# Patient Record
Sex: Female | Born: 1955
Health system: Southern US, Community
[De-identification: ages and names within clinical notes are randomized; demographics above are authoritative.]

## PROBLEM LIST (undated history)

## (undated) DIAGNOSIS — E119 Type 2 diabetes mellitus without complications: Secondary | ICD-10-CM

## (undated) DIAGNOSIS — T7840XA Allergy, unspecified, initial encounter: Secondary | ICD-10-CM

## (undated) DIAGNOSIS — K219 Gastro-esophageal reflux disease without esophagitis: Secondary | ICD-10-CM

## (undated) DIAGNOSIS — E079 Disorder of thyroid, unspecified: Secondary | ICD-10-CM

## (undated) DIAGNOSIS — E785 Hyperlipidemia, unspecified: Secondary | ICD-10-CM

## (undated) DIAGNOSIS — I10 Essential (primary) hypertension: Secondary | ICD-10-CM

## (undated) DIAGNOSIS — G4730 Sleep apnea, unspecified: Secondary | ICD-10-CM

## (undated) HISTORY — DX: Allergy, unspecified, initial encounter: T78.40XA

## (undated) HISTORY — DX: Type 2 diabetes mellitus without complications: E11.9

## (undated) HISTORY — DX: Disorder of thyroid, unspecified: E07.9

## (undated) HISTORY — DX: Sleep apnea, unspecified: G47.30

## (undated) HISTORY — DX: Essential (primary) hypertension: I10

## (undated) HISTORY — DX: Gastro-esophageal reflux disease without esophagitis: K21.9

## (undated) HISTORY — DX: Hyperlipidemia, unspecified: E78.5

---

## 2016-04-22 DIAGNOSIS — E1159 Type 2 diabetes mellitus with other circulatory complications: Secondary | ICD-10-CM | POA: Insufficient documentation

## 2016-04-22 DIAGNOSIS — I10 Essential (primary) hypertension: Secondary | ICD-10-CM

## 2016-04-22 DIAGNOSIS — E119 Type 2 diabetes mellitus without complications: Secondary | ICD-10-CM | POA: Insufficient documentation

## 2016-04-22 DIAGNOSIS — K219 Gastro-esophageal reflux disease without esophagitis: Secondary | ICD-10-CM | POA: Insufficient documentation

## 2016-04-22 DIAGNOSIS — E559 Vitamin D deficiency, unspecified: Secondary | ICD-10-CM | POA: Insufficient documentation

## 2016-07-24 DIAGNOSIS — E039 Hypothyroidism, unspecified: Secondary | ICD-10-CM | POA: Insufficient documentation

## 2016-12-18 DIAGNOSIS — Z6841 Body Mass Index (BMI) 40.0 and over, adult: Secondary | ICD-10-CM

## 2018-06-09 ENCOUNTER — Encounter: Payer: Self-pay | Admitting: Family Medicine

## 2018-06-09 ENCOUNTER — Ambulatory Visit: Payer: BLUE CROSS/BLUE SHIELD | Admitting: Family Medicine

## 2018-06-09 VITALS — BP 135/73 | HR 60 | Temp 98.0°F | Ht 65.0 in | Wt 259.0 lb

## 2018-06-09 DIAGNOSIS — E559 Vitamin D deficiency, unspecified: Secondary | ICD-10-CM

## 2018-06-09 DIAGNOSIS — I10 Essential (primary) hypertension: Secondary | ICD-10-CM | POA: Diagnosis not present

## 2018-06-09 DIAGNOSIS — E039 Hypothyroidism, unspecified: Secondary | ICD-10-CM

## 2018-06-09 DIAGNOSIS — Z114 Encounter for screening for human immunodeficiency virus [HIV]: Secondary | ICD-10-CM

## 2018-06-09 DIAGNOSIS — Z130 Encounter for screening for diseases of the blood and blood-forming organs and certain disorders involving the immune mechanism: Secondary | ICD-10-CM

## 2018-06-09 DIAGNOSIS — Z7689 Persons encountering health services in other specified circumstances: Secondary | ICD-10-CM

## 2018-06-09 DIAGNOSIS — E119 Type 2 diabetes mellitus without complications: Secondary | ICD-10-CM | POA: Diagnosis not present

## 2018-06-09 MED ORDER — HYDROCHLOROTHIAZIDE 25 MG PO TABS: 25.0000 mg | ORAL_TABLET | Freq: Every day | ORAL | 1 refills | Status: DC

## 2018-06-09 NOTE — Patient Instructions (Addendum)
I have placed future orders.  Please come in fasting.  You may take your medications that morning but only with water or black coffee.  Schedule a full physical exam for sometime in January or February.  We will plan for twice yearly labs thereafter if all of your levels are at goal.  Schedule a mammogram appointment when you leave today.  Schedule an eye exam with Dr. Conley Rolls. We will discuss colon cancer screening again at your next visit.  We discussed Cologuard as an alternative to Colonoscopy today.  Continue your current medication regimen.  Your hydrochlorthiazide has been sent to the pharmacy.   Tips for Eating Away From Home If You Have Diabetes Controlling your level of blood glucose, also known as blood sugar, can be challenging. It can be even more difficult when you do not prepare your own meals. The following tips can help you manage your diabetes when you eat away from home. Planning ahead Plan ahead if you know you will be eating away from home:  Ask your health care provider how to time meals and medicine if you are taking insulin.  Make a list of restaurants near you that offer healthy choices. If they have a carry-out menu, take it home and plan what you will order ahead of time.  Look up the restaurant you want to eat at online. Many chain and fast-food restaurants list nutritional information online. Use this information to choose the healthiest options and to calculate how many carbohydrates will be in your meal.  Use a carbohydrate-counting book or mobile app to look up the carbohydrate content and serving size of the foods you want to eat.  Become familiar with serving sizes and learn to recognize how many servings are in a portion. This will allow you to estimate how many carbohydrates you can eat.  Free foods A "free food" is any food or drink that has less than 5 g of carbohydrates per serving. Free foods include:  Many vegetables.  Hard boiled eggs.  Nuts or  seeds.  Olives.  Cheeses.  Meats.  These types of foods make good appetizer choices and are often available at salad bars. Lemon juice, vinegar, or a low-calorie salad dressing of fewer than 20 calories per serving can be used as a "free" salad dressing. Choices to reduce carbohydrates  Substitute nonfat sweetened yogurt with a sugar-free yogurt. Yogurt made from soy milk may also be used, but you will still want a sugar-free or plain option to choose a lower carbohydrate amount.  Ask your server to take away the bread basket or chips from your table.  Order fresh fruit. A salad bar often offers fresh fruit choices. Avoid canned fruit because it is usually packed in sugar or syrup.  Order a salad, and eat it without dressing. Or, create a "free" salad dressing.  Ask for substitutions. For example, instead of [place] fries, request an order of a vegetable such as salad, green beans, or broccoli. Other tips  If you take insulin, take the insulin once your food arrives to your table. This will ensure your insulin and food are timed correctly.  Ask your server about the portion size before your order, and ask for a take-out box if the portion has more servings than you should have. When your food comes, leave the amount you should have on the plate, and put the rest in the take-out box.  Consider splitting an entree with someone and ordering a side salad. This information  is not intended to replace advice given to you by your health care provider. Make sure you discuss any questions you have with your health care provider. Document Released: 09/23/2005 Document Revised: 02/29/2016 Document Reviewed: 12/21/2013 Elsevier Interactive Patient Education  [employer].

## 2018-06-09 NOTE — Progress Notes (Signed)
Subjective: VX:[id number] care, HTN HPI: Heather Palmer is a 62 y.o. female presenting to clinic today for:  1. Hypertension  Patient reports compliance with her hydrochlorothiazide, atenolol and Cozaar.  She states that she needs a refill on the hydrochlorothiazide. Denies headache, dizziness, nausea, vomiting, chest pain, LE swelling, abdominal pain or shortness of breath.  She does report that she needs a new eye exam but has been putting this off because she is expecting to switch over to [facility] in January.  2.  Hypothyroidism Patient reports a several year history of hypothyroidism.  She reports occasional diarrhea that seems to be associated with her food intake.  No central tremor, heart palpitations.  She reports compliance with her current dose of Synthroid.  Last TSH was performed in February 2019 and was within normal limits.  3.  Type 2 diabetes with hyperlipidemia Patient reports compliance with metformin 1000 mg p.o. twice daily, Lipitor 40 mg daily.  She is on an ARB as above.  She is due for diabetic eye exam.  Denies any history of foot ulcer.  She had a urine microalbumin with her previous PCP that was normal recently.  4.  Preventive care Patient reports that she has never had a colonoscopy or any type of colon cancer screening.  No known family history of colon cancer.  Denies any rectal bleeding or unplanned weight loss.  She notes that mammogram has been greater than 2 years.  Denies any breast concerns.  She is up-to-date on hepatitis C screening.  May need DEXA scan.  No HIV screening appreciated in EMR.   Past Medical History:  Diagnosis Date  . Allergy   . Diabetes mellitus without complication ([place])   . GERD (gastroesophageal reflux disease)   . Hyperlipidemia   . Hypertension   . Sleep apnea   . Thyroid disease    Past Surgical History:  Procedure Laterality Date  . CESAREAN SECTION     Social History   Socioeconomic History  . Marital status: Married      Spouse name: Not on file  . Number of children: Not on file  . Years of education: Not on file  . Highest education level: Not on file  Occupational History  . Not on file  Social Needs  . Financial resource strain: Not on file  . Food insecurity:    Worry: Not on file    Inability: Not on file  . Transportation needs:    Medical: Not on file    Non-medical: Not on file  Tobacco Use  . Smoking status: Never Smoker  . Smokeless tobacco: Never Used  Substance and Sexual Activity  . Alcohol use: Never    Frequency: Never  . Drug use: Never  . Sexual activity: Not on file  Lifestyle  . Physical activity:    Days per week: Not on file    Minutes per session: Not on file  . Stress: Not on file  Relationships  . Social connections:    Talks on phone: Not on file    Gets together: Not on file    Attends religious service: Not on file    Active member of club or organization: Not on file    Attends meetings of clubs or organizations: Not on file    Relationship status: Not on file  . Intimate partner violence:    Fear of current or ex partner: Not on file    Emotionally abused: Not on file    Physically  abused: Not on file    Forced sexual activity: Not on file  Other Topics Concern  . Not on file  Social History Narrative  . Not on file   Current Meds  Medication Sig  . atenolol (TENORMIN) 100 MG tablet Take 100 mg by mouth daily.  Marland Kitchen atorvastatin (LIPITOR) 40 MG tablet Take 40 mg by mouth daily.  . Cholecalciferol 5000 units capsule Take by mouth.  . famotidine (PEPCID) 20 MG tablet TAKE 1 TABLET BY MOUTH TWICE DAILY AS NEEDED FOR HEARTBURN  . hydrochlorothiazide (HYDRODIURIL) 25 MG tablet Take 1 tablet (25 mg total) by mouth daily.  Marland Kitchen levothyroxine (SYNTHROID, LEVOTHROID) 50 MCG tablet Take 50 mcg by mouth daily.  Marland Kitchen losartan (COZAAR) 25 MG tablet Take by mouth.  . metFORMIN (GLUCOPHAGE) 500 MG tablet Take 1,000 mg by mouth 2 (two) times daily with a meal.  .  [DISCONTINUED] hydrochlorothiazide (HYDRODIURIL) 25 MG tablet Take 25 mg by mouth daily.   Family History  Problem Relation Age of Onset  . Diabetes Mother   . Heart disease Mother   . COPD Father    Allergies  Allergen Reactions  . Carvedilol      Health Maintenance: Needs Mammo, Colon cancer screen, HIV screen ROS: Per HPI  Objective: Office vital signs reviewed. BP 135/73   Pulse 60   Temp 98 F (36.7 C) (Oral)   Ht '5\' 5"'  (1.651 m)   Wt 259 lb (117.5 kg)   BMI 43.10 kg/m   Physical Examination:  General: Awake, alert, well nourished, obese, No acute distress HEENT: Normal    Neck: No masses palpated. No lymphadenopathy; thyroid slightly full but no appreciable goiter or thyroid nodules.    Eyes: PERRLA, extraocular movement in tact, sclera white, no exophthalmos    Throat: moist mucus membranes Cardio: regular rate and rhythm, S1S2 heard, no murmurs appreciated Pulm: clear to auscultation bilaterally, no wheezes, rhonchi or rales; normal work of breathing on room air Extremities: warm, well perfused, No edema, cyanosis or clubbing; +2 pulses bilaterally MSK: Normal gait and normal station Skin: dry; intact; no rashes or lesions Neuro: No resting tremor Depression screen PHQ 2/9 06/09/2018  Decreased Interest 0  Down, Depressed, Hopeless 0  PHQ - 2 Score 0     Assessment/ Plan: 61 y.o. female   1. Essential hypertension Blood pressure under good control on current regimen.  Hydrochlorthiazide has been refilled.  I reviewed her metabolic panel from February 2019.  She did have a slightly elevated alkaline phosphatase, I wonder if this is related to her diabetes.  We will plan to recheck CMP with her fasting labs.  We will also check lipid panel. - CMP14+EGFR; Future - Lipid Panel; Future  2. Encounter to establish care  3. Acquired hypothyroidism TSH was within normal limits in February.  We will plan to check this annually versus biannually pending control  with current dose of thyroid medicine. - TSH; Future  4. Controlled type 2 diabetes mellitus without complication, without long-term current use of insulin (HCC) A1c from February 2019 was within normal limits.  I reviewed her last several hemoglobin A1c's which have always been around 6.5-6.6.  This level was not repeated today given stability.  We will plan to obtain with her February labs. - Bayer DCA Hb A1c Waived; Future - Lipid Panel; Future  5. Vitamin D deficiency Currently on OTC vitamin D.  Will need DEXA scan at some point. - VITAMIN D 25 Hydroxy (Vit-D Deficiency, Fractures); Future  6. Screening, anemia, deficiency, iron Will check CBC given use of metformin. - CBC with Differential; Future  7. Screening for HIV without presence of risk factors - HIV antibody (with reflex); Future   Torrance Frech Wind[place], [place] [phone]

## 2018-08-18 ENCOUNTER — Other Ambulatory Visit: Payer: Self-pay | Admitting: *Deleted

## 2018-08-18 MED ORDER — METFORMIN HCL 500 MG PO TABS: 1000.0000 mg | ORAL_TABLET | Freq: Two times a day (BID) | ORAL | 1 refills | Status: DC

## 2018-08-18 MED ORDER — ATENOLOL 100 MG PO TABS: 100.0000 mg | ORAL_TABLET | Freq: Every day | ORAL | 1 refills | Status: DC

## 2018-08-18 MED ORDER — LEVOTHYROXINE SODIUM 50 MCG PO TABS: 50.0000 ug | ORAL_TABLET | Freq: Every day | ORAL | 1 refills | Status: DC

## 2018-08-18 MED ORDER — ATORVASTATIN CALCIUM 40 MG PO TABS: 40.0000 mg | ORAL_TABLET | Freq: Every day | ORAL | 1 refills | Status: DC

## 2018-08-21 ENCOUNTER — Other Ambulatory Visit: Payer: Self-pay | Admitting: Family Medicine

## 2018-08-21 MED ORDER — LOSARTAN POTASSIUM 25 MG PO TABS: 25.0000 mg | ORAL_TABLET | Freq: Every day | ORAL | 3 refills | Status: DC

## 2018-08-21 NOTE — Telephone Encounter (Signed)
What is the name of the medication? Losartan 25 mg Patient is out and needs today for her dosage for today. Thought it was done yesterday with the other meds and she didn't have it.  Have you contacted your pharmacy to request a refill? YES  Which pharmacy would you like this sent to? [facility] in [place]   Patient notified that their request is being sent to the clinical staff for review and that they should receive a call once it is complete. If they do not receive a call within 24 hours they can check with their pharmacy or our office.

## 2018-09-10 ENCOUNTER — Ambulatory Visit (INDEPENDENT_AMBULATORY_CARE_PROVIDER_SITE_OTHER): Payer: BLUE CROSS/BLUE SHIELD | Admitting: *Deleted

## 2018-09-10 DIAGNOSIS — Z23 Encounter for immunization: Secondary | ICD-10-CM | POA: Diagnosis not present

## 2018-09-28 ENCOUNTER — Other Ambulatory Visit: Payer: Self-pay | Admitting: Family Medicine

## 2018-09-28 MED ORDER — FAMOTIDINE 20 MG PO TABS: ORAL_TABLET | ORAL | 1 refills | Status: DC

## 2018-11-09 ENCOUNTER — Encounter: Payer: BLUE CROSS/BLUE SHIELD | Admitting: Family Medicine

## 2018-11-30 ENCOUNTER — Ambulatory Visit (INDEPENDENT_AMBULATORY_CARE_PROVIDER_SITE_OTHER): Payer: Medicare Other | Admitting: Family Medicine

## 2018-11-30 VITALS — BP 133/74 | HR 72 | Temp 98.2°F | Ht 65.0 in | Wt 258.0 lb

## 2018-11-30 DIAGNOSIS — E039 Hypothyroidism, unspecified: Secondary | ICD-10-CM

## 2018-11-30 DIAGNOSIS — I10 Essential (primary) hypertension: Secondary | ICD-10-CM

## 2018-11-30 DIAGNOSIS — Z124 Encounter for screening for malignant neoplasm of cervix: Secondary | ICD-10-CM | POA: Diagnosis not present

## 2018-11-30 DIAGNOSIS — Z0000 Encounter for general adult medical examination without abnormal findings: Secondary | ICD-10-CM

## 2018-11-30 DIAGNOSIS — Z130 Encounter for screening for diseases of the blood and blood-forming organs and certain disorders involving the immune mechanism: Secondary | ICD-10-CM

## 2018-11-30 DIAGNOSIS — N888 Other specified noninflammatory disorders of cervix uteri: Secondary | ICD-10-CM

## 2018-11-30 DIAGNOSIS — E119 Type 2 diabetes mellitus without complications: Secondary | ICD-10-CM

## 2018-11-30 DIAGNOSIS — E1159 Type 2 diabetes mellitus with other circulatory complications: Secondary | ICD-10-CM

## 2018-11-30 DIAGNOSIS — Z0001 Encounter for general adult medical examination with abnormal findings: Secondary | ICD-10-CM

## 2018-11-30 DIAGNOSIS — N898 Other specified noninflammatory disorders of vagina: Secondary | ICD-10-CM

## 2018-11-30 DIAGNOSIS — I152 Hypertension secondary to endocrine disorders: Secondary | ICD-10-CM

## 2018-11-30 DIAGNOSIS — G8929 Other chronic pain: Secondary | ICD-10-CM

## 2018-11-30 DIAGNOSIS — Z1211 Encounter for screening for malignant neoplasm of colon: Secondary | ICD-10-CM

## 2018-11-30 DIAGNOSIS — M25512 Pain in left shoulder: Secondary | ICD-10-CM | POA: Diagnosis not present

## 2018-11-30 DIAGNOSIS — E559 Vitamin D deficiency, unspecified: Secondary | ICD-10-CM

## 2018-11-30 DIAGNOSIS — Z114 Encounter for screening for human immunodeficiency virus [HIV]: Secondary | ICD-10-CM

## 2018-11-30 DIAGNOSIS — Z780 Asymptomatic menopausal state: Secondary | ICD-10-CM

## 2018-11-30 DIAGNOSIS — N930 Postcoital and contact bleeding: Secondary | ICD-10-CM

## 2018-11-30 LAB — WET PREP FOR TRICH, YEAST, CLUE
Clue Cell Exam: NEGATIVE
TRICHOMONAS EXAM: NEGATIVE
Yeast Exam: NEGATIVE

## 2018-11-30 LAB — BAYER DCA HB A1C WAIVED: HB A1C (BAYER DCA - WAIVED): 6.8 % (ref <7.0)

## 2018-11-30 NOTE — Patient Instructions (Addendum)
You had labs performed today.  You will be contacted with the results of the labs once they are available, usually in the next 3 business days for routine lab work.  If you had a pap smear or biopsy performed, expect to be contacted in about 7-10 days.  You are overdue for bone density scan.  You can have this done at any time. It has been ordered.  The cologuard has been ordered and you should receive it at your home soon.  Please send it back as soon as possible.    There was a little growth on the cervix which is what I think may be the cause of your bleeding/ discomfort.  I have referred you to the gyn for further evaluation/ removal.  Health Maintenance, Female Adopting a healthy lifestyle and getting preventive care can go a long way to promote health and wellness. Talk with your health care provider about what schedule of regular examinations is right for you. This is a good chance for you to check in with your provider about disease prevention and staying healthy. In between checkups, there are plenty of things you can do on your own. Experts have done a lot of research about which lifestyle changes and preventive measures are most likely to keep you healthy. Ask your health care provider for more information. Weight and diet Eat a healthy diet  Be sure to include plenty of vegetables, fruits, low-fat dairy products, and lean protein.  Do not eat a lot of foods high in solid fats, added sugars, or salt.  Get regular exercise. This is one of the most important things you can do for your health. ? Most adults should exercise for at least 150 minutes each week. The exercise should increase your heart rate and make you sweat (moderate-intensity exercise). ? Most adults should also do strengthening exercises at least twice a week. This is in addition to the moderate-intensity exercise. Maintain a healthy weight  Body mass index (BMI) is a measurement that can be used to identify possible  weight problems. It estimates body fat based on height and weight. Your health care provider can help determine your BMI and help you achieve or maintain a healthy weight.  For females 31 years of age and older: ? A BMI below 18.5 is considered underweight. ? A BMI of 18.5 to 24.9 is normal. ? A BMI of 25 to 29.9 is considered overweight. ? A BMI of 30 and above is considered obese. Watch levels of cholesterol and blood lipids  You should start having your blood tested for lipids and cholesterol at 65 years of age, then have this test every 5 years.  You may need to have your cholesterol levels checked more often if: ? Your lipid or cholesterol levels are high. ? You are older than 65 years of age. ? You are at high risk for heart disease. Cancer screening Lung Cancer  Lung cancer screening is recommended for adults 49-44 years old who are at high risk for lung cancer because of a history of smoking.  A yearly low-dose CT scan of the lungs is recommended for people who: ? Currently smoke. ? Have quit within the past 15 years. ? Have at least a 30-pack-year history of smoking. A pack year is smoking an average of one pack of cigarettes a day for 1 year.  Yearly screening should continue until it has been 15 years since you quit.  Yearly screening should stop if you develop a  health problem that would prevent you from having lung cancer treatment. Breast Cancer  Practice breast self-awareness. This means understanding how your breasts normally appear and feel.  It also means doing regular breast self-exams. Let your health care provider know about any changes, no matter how small.  If you are in your 20s or 30s, you should have a clinical breast exam (CBE) by a health care provider every 1-3 years as part of a regular health exam.  If you are 39 or older, have a CBE every year. Also consider having a breast X-ray (mammogram) every year.  If you have a family history of breast  cancer, talk to your health care provider about genetic screening.  If you are at high risk for breast cancer, talk to your health care provider about having an MRI and a mammogram every year.  Breast cancer gene (BRCA) assessment is recommended for women who have family members with BRCA-related cancers. BRCA-related cancers include: ? Breast. ? Ovarian. ? Tubal. ? Peritoneal cancers.  Results of the assessment will determine the need for genetic counseling and BRCA1 and BRCA2 testing. Cervical Cancer Your health care provider may recommend that you be screened regularly for cancer of the pelvic organs (ovaries, uterus, and vagina). This screening involves a pelvic examination, including checking for microscopic changes to the surface of your cervix (Pap test). You may be encouraged to have this screening done every 3 years, beginning at age 87.  For women ages 29-65, health care providers may recommend pelvic exams and Pap testing every 3 years, or they may recommend the Pap and pelvic exam, combined with testing for human papilloma virus (HPV), every 5 years. Some types of HPV increase your risk of cervical cancer. Testing for HPV may also be done on women of any age with unclear Pap test results.  Other health care providers may not recommend any screening for nonpregnant women who are considered low risk for pelvic cancer and who do not have symptoms. Ask your health care provider if a screening pelvic exam is right for you.  If you have had past treatment for cervical cancer or a condition that could lead to cancer, you need Pap tests and screening for cancer for at least 20 years after your treatment. If Pap tests have been discontinued, your risk factors (such as having a new sexual partner) need to be reassessed to determine if screening should resume. Some women have medical problems that increase the chance of getting cervical cancer. In these cases, your health care provider may  recommend more frequent screening and Pap tests. Colorectal Cancer  This type of cancer can be detected and often prevented.  Routine colorectal cancer screening usually begins at 65 years of age and continues through 65 years of age.  Your health care provider may recommend screening at an earlier age if you have risk factors for colon cancer.  Your health care provider may also recommend using home test kits to check for hidden blood in the stool.  A small camera at the end of a tube can be used to examine your colon directly (sigmoidoscopy or colonoscopy). This is done to check for the earliest forms of colorectal cancer.  Routine screening usually begins at age 16.  Direct examination of the colon should be repeated every 5-10 years through 65 years of age. However, you may need to be screened more often if early forms of precancerous polyps or small growths are found. Skin Cancer  Check your  skin from head to toe regularly.  Tell your health care provider about any new moles or changes in moles, especially if there is a change in a mole's shape or color.  Also tell your health care provider if you have a mole that is larger than the size of a pencil eraser.  Always use sunscreen. Apply sunscreen liberally and repeatedly throughout the day.  Protect yourself by wearing long sleeves, pants, a wide-brimmed hat, and sunglasses whenever you are outside. Heart disease, diabetes, and high blood pressure  High blood pressure causes heart disease and increases the risk of stroke. High blood pressure is more likely to develop in: ? People who have blood pressure in the high end of the normal range (130-139/85-89 mm Hg). ? People who are overweight or obese. ? People who are African American.  If you are 53-34 years of age, have your blood pressure checked every 3-5 years. If you are 36 years of age or older, have your blood pressure checked every year. You should have your blood pressure  measured twice-once when you are at a hospital or clinic, and once when you are not at a hospital or clinic. Record the average of the two measurements. To check your blood pressure when you are not at a hospital or clinic, you can use: ? An automated blood pressure machine at a pharmacy. ? A home blood pressure monitor.  If you are between 54 years and 65 years old, ask your health care provider if you should take aspirin to prevent strokes.  Have regular diabetes screenings. This involves taking a blood sample to check your fasting blood sugar level. ? If you are at a normal weight and have a low risk for diabetes, have this test once every three years after 65 years of age. ? If you are overweight and have a high risk for diabetes, consider being tested at a younger age or more often. Preventing infection Hepatitis B  If you have a higher risk for hepatitis B, you should be screened for this virus. You are considered at high risk for hepatitis B if: ? You were born in a country where hepatitis B is common. Ask your health care provider which countries are considered high risk. ? Your parents were born in a high-risk country, and you have not been immunized against hepatitis B (hepatitis B vaccine). ? You have HIV or AIDS. ? You use needles to inject street drugs. ? You live with someone who has hepatitis B. ? You have had sex with someone who has hepatitis B. ? You get hemodialysis treatment. ? You take certain medicines for conditions, including cancer, organ transplantation, and autoimmune conditions. Hepatitis C  Blood testing is recommended for: ? Everyone born from [birth year] through 1967. ? Anyone with known risk factors for hepatitis C. Sexually transmitted infections (STIs)  You should be screened for sexually transmitted infections (STIs) including gonorrhea and chlamydia if: ? You are sexually active and are younger than 65 years of age. ? You are older than 65 years of age and  your health care provider tells you that you are at risk for this type of infection. ? Your sexual activity has changed since you were last screened and you are at an increased risk for chlamydia or gonorrhea. Ask your health care provider if you are at risk.  If you do not have HIV, but are at risk, it may be recommended that you take a prescription medicine daily to prevent HIV  infection. This is called pre-exposure prophylaxis (PrEP). You are considered at risk if: ? You are sexually active and do not regularly use condoms or know the HIV status of your partner(s). ? You take drugs by injection. ? You are sexually active with a partner who has HIV. Talk with your health care provider about whether you are at high risk of being infected with HIV. If you choose to begin PrEP, you should first be tested for HIV. You should then be tested every 3 months for as long as you are taking PrEP. Pregnancy  If you are premenopausal and you may become pregnant, ask your health care provider about preconception counseling.  If you may become pregnant, take 400 to 800 micrograms (mcg) of folic acid every day.  If you want to prevent pregnancy, talk to your health care provider about birth control (contraception). Osteoporosis and menopause  Osteoporosis is a disease in which the bones lose minerals and strength with aging. This can result in serious bone fractures. Your risk for osteoporosis can be identified using a bone density scan.  If you are 70 years of age or older, or if you are at risk for osteoporosis and fractures, ask your health care provider if you should be screened.  Ask your health care provider whether you should take a calcium or vitamin D supplement to lower your risk for osteoporosis.  Menopause may have certain physical symptoms and risks.  Hormone replacement therapy may reduce some of these symptoms and risks. Talk to your health care provider about whether hormone replacement  therapy is right for you. Follow these instructions at home:  Schedule regular health, dental, and eye exams.  Stay current with your immunizations.  Do not use any tobacco products including cigarettes, chewing tobacco, or electronic cigarettes.  If you are pregnant, do not drink alcohol.  If you are breastfeeding, limit how much and how often you drink alcohol.  Limit alcohol intake to no more than 1 drink per day for nonpregnant women. One drink equals 12 ounces of beer, 5 ounces of wine, or 1 ounces of hard liquor.  Do not use street drugs.  Do not share needles.  Ask your health care provider for help if you need support or information about quitting drugs.  Tell your health care provider if you often feel depressed.  Tell your health care provider if you have ever been abused or do not feel safe at home. This information is not intended to replace advice given to you by your health care provider. Make sure you discuss any questions you have with your health care provider. Document Released: 04/08/2011 Document Revised: 02/29/2016 Document Reviewed: 06/27/2015 Elsevier Interactive Patient Education  2019 [employer].
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
## 2018-12-01 LAB — CBC WITH DIFFERENTIAL/PLATELET
Basophils Absolute: 0.1 10*3/uL (ref 0.0–0.2)
Basos: 1 %
EOS (ABSOLUTE): 0.3 10*3/uL (ref 0.0–0.4)
Eos: 3 %
Hematocrit: 36.3 % (ref 34.0–46.6)
Hemoglobin: 12.8 g/dL (ref 11.1–15.9)
IMMATURE GRANS (ABS): 0 10*3/uL (ref 0.0–0.1)
Immature Granulocytes: 0 %
LYMPHS: 27 %
Lymphocytes Absolute: 2.6 10*3/uL (ref 0.7–3.1)
MCH: 30.6 pg (ref 26.6–33.0)
MCHC: 35.3 g/dL (ref 31.5–35.7)
MCV: 87 fL (ref 79–97)
Monocytes Absolute: 0.6 10*3/uL (ref 0.1–0.9)
Monocytes: 7 %
Neutrophils Absolute: 5.8 10*3/uL (ref 1.4–7.0)
Neutrophils: 62 %
Platelets: 337 10*3/uL (ref 150–450)
RBC: 4.18 x10E6/uL (ref 3.77–5.28)
RDW: 12.4 % (ref 11.7–15.4)
WBC: 9.4 10*3/uL (ref 3.4–10.8)

## 2018-12-01 LAB — CMP14+EGFR
ALT: 17 IU/L (ref 0–32)
AST: 16 IU/L (ref 0–40)
Albumin/Globulin Ratio: 1.5 (ref 1.2–2.2)
Albumin: 4.4 g/dL (ref 3.8–4.8)
Alkaline Phosphatase: 148 IU/L — ABNORMAL HIGH (ref 39–117)
BUN/Creatinine Ratio: 16 (ref 12–28)
BUN: 14 mg/dL (ref 8–27)
Bilirubin Total: 0.2 mg/dL (ref 0.0–1.2)
CO2: 22 mmol/L (ref 20–29)
Calcium: 9.8 mg/dL (ref 8.7–10.3)
Chloride: 100 mmol/L (ref 96–106)
Creatinine, Ser: 0.86 mg/dL (ref 0.57–1.00)
GFR calc Af Amer: 82 mL/min/{1.73_m2} (ref >59)
GFR calc non Af Amer: 71 mL/min/{1.73_m2} (ref >59)
Globulin, Total: 3 g/dL (ref 1.5–4.5)
Glucose: 133 mg/dL — ABNORMAL HIGH (ref 65–99)
Potassium: 3.9 mmol/L (ref 3.5–5.2)
Sodium: 141 mmol/L (ref 134–144)
Total Protein: 7.4 g/dL (ref 6.0–8.5)

## 2018-12-01 LAB — HIV ANTIBODY (ROUTINE TESTING W REFLEX): HIV SCREEN 4TH GENERATION: NONREACTIVE

## 2018-12-01 LAB — LIPID PANEL
Chol/HDL Ratio: 3.1 ratio (ref 0.0–4.4)
Cholesterol, Total: 135 mg/dL (ref 100–199)
HDL: 43 mg/dL (ref >39)
LDL Calculated: 62 mg/dL (ref 0–99)
Triglycerides: 150 mg/dL — ABNORMAL HIGH (ref 0–149)
VLDL Cholesterol Cal: 30 mg/dL (ref 5–40)

## 2018-12-01 LAB — TSH: TSH: 2.84 u[IU]/mL (ref 0.450–4.500)

## 2018-12-01 LAB — VITAMIN D 25 HYDROXY (VIT D DEFICIENCY, FRACTURES): Vit D, 25-Hydroxy: 57.4 ng/mL (ref 30.0–100.0)

## 2018-12-02 ENCOUNTER — Encounter: Payer: Self-pay | Admitting: *Deleted

## 2018-12-02 LAB — PAP IG, CT-NG NAA, HPV HIGH-RISK
Chlamydia, Nuc. Acid Amp: NEGATIVE
Gonococcus by Nucleic Acid Amp: NEGATIVE
HPV, high-risk: NEGATIVE

## 2018-12-09 ENCOUNTER — Encounter: Payer: Self-pay | Admitting: Family Medicine

## 2018-12-21 ENCOUNTER — Other Ambulatory Visit: Payer: Self-pay

## 2018-12-21 ENCOUNTER — Other Ambulatory Visit: Payer: Medicare Other

## 2018-12-21 ENCOUNTER — Ambulatory Visit (INDEPENDENT_AMBULATORY_CARE_PROVIDER_SITE_OTHER): Payer: Medicare Other

## 2018-12-21 DIAGNOSIS — Z780 Asymptomatic menopausal state: Secondary | ICD-10-CM | POA: Diagnosis not present

## 2018-12-21 DIAGNOSIS — Z13820 Encounter for screening for osteoporosis: Secondary | ICD-10-CM | POA: Diagnosis not present

## 2019-02-05 ENCOUNTER — Telehealth: Payer: Self-pay | Admitting: Family Medicine

## 2019-02-11 ENCOUNTER — Other Ambulatory Visit: Payer: Self-pay | Admitting: Family Medicine

## 2019-02-12 ENCOUNTER — Other Ambulatory Visit: Payer: Self-pay | Admitting: Family Medicine

## 2019-02-16 ENCOUNTER — Other Ambulatory Visit: Payer: Self-pay | Admitting: Family Medicine

## 2019-03-10 ENCOUNTER — Other Ambulatory Visit: Payer: Self-pay | Admitting: *Deleted

## 2019-03-10 MED ORDER — HYDROCHLOROTHIAZIDE 25 MG PO TABS: 25.0000 mg | ORAL_TABLET | Freq: Every day | ORAL | 0 refills | Status: DC

## 2019-03-22 ENCOUNTER — Other Ambulatory Visit: Payer: Self-pay | Admitting: Family Medicine

## 2019-03-23 NOTE — Telephone Encounter (Signed)
Gottschalk. NTBS for 3 mos ckup (May) refill sent to pharmacy

## 2019-03-23 NOTE — Telephone Encounter (Signed)
Left message for patient to call to schedule diabetic recheck.

## 2019-04-14 ENCOUNTER — Telehealth: Payer: Self-pay | Admitting: Family Medicine

## 2019-05-10 ENCOUNTER — Ambulatory Visit (INDEPENDENT_AMBULATORY_CARE_PROVIDER_SITE_OTHER): Payer: Medicare Other | Admitting: Family Medicine

## 2019-05-10 ENCOUNTER — Encounter: Payer: Self-pay | Admitting: Family Medicine

## 2019-05-10 ENCOUNTER — Other Ambulatory Visit: Payer: Self-pay

## 2019-05-10 VITALS — BP 116/70 | HR 72 | Temp 97.9°F | Ht 65.0 in | Wt 261.0 lb

## 2019-05-10 DIAGNOSIS — E039 Hypothyroidism, unspecified: Secondary | ICD-10-CM

## 2019-05-10 DIAGNOSIS — M67471 Ganglion, right ankle and foot: Secondary | ICD-10-CM

## 2019-05-10 DIAGNOSIS — E1159 Type 2 diabetes mellitus with other circulatory complications: Secondary | ICD-10-CM

## 2019-05-10 DIAGNOSIS — Z6841 Body Mass Index (BMI) 40.0 and over, adult: Secondary | ICD-10-CM

## 2019-05-10 DIAGNOSIS — Z23 Encounter for immunization: Secondary | ICD-10-CM | POA: Diagnosis not present

## 2019-05-10 DIAGNOSIS — E119 Type 2 diabetes mellitus without complications: Secondary | ICD-10-CM | POA: Diagnosis not present

## 2019-05-10 DIAGNOSIS — E785 Hyperlipidemia, unspecified: Secondary | ICD-10-CM

## 2019-05-10 DIAGNOSIS — I10 Essential (primary) hypertension: Secondary | ICD-10-CM

## 2019-05-10 DIAGNOSIS — E1169 Type 2 diabetes mellitus with other specified complication: Secondary | ICD-10-CM | POA: Diagnosis not present

## 2019-05-10 LAB — BAYER DCA HB A1C WAIVED: HB A1C (BAYER DCA - WAIVED): 6.6 % (ref <7.0)

## 2019-05-10 MED ORDER — ATENOLOL 100 MG PO TABS: 100.0000 mg | ORAL_TABLET | Freq: Every day | ORAL | 3 refills | Status: DC

## 2019-05-10 MED ORDER — HYDROCHLOROTHIAZIDE 25 MG PO TABS: 25.0000 mg | ORAL_TABLET | Freq: Every day | ORAL | 3 refills | Status: DC

## 2019-05-10 MED ORDER — METFORMIN HCL 500 MG PO TABS: 1000.0000 mg | ORAL_TABLET | Freq: Two times a day (BID) | ORAL | 3 refills | Status: DC

## 2019-05-10 MED ORDER — ATORVASTATIN CALCIUM 40 MG PO TABS: 40.0000 mg | ORAL_TABLET | Freq: Every day | ORAL | 3 refills | Status: DC

## 2019-05-10 NOTE — Progress Notes (Signed)
Subjective: CC: DM f/u PCP: Janora Norlander, DO Heather Palmer is a 63 y.o. female presenting to clinic today for:  1. Type 2 Diabetes w/ HTN and HLD:  Patient reports that she feels good. Taking medication(s): Metformin, Losartan/ HCTZ, Lipitor.  Last eye exam: needs; she will be scheduling with Dr. Marin Comment soon. Last foot exam: needs Last A1c:  Lab Results  Component Value Date   HGBA1C 6.8 11/30/2018   Nephropathy screen indicated?: on ARB Last flu, zoster and/or pneumovax:  Immunization History  Administered Date(s) Administered  . Influenza,inj,Quad PF,6+ Mos 09/10/2018  . Pneumococcal Polysaccharide-23 12/18/2016  . Tdap 12/18/2016    ROS: No chest pain, shortness of breath, lower extremity edema.  No sensation changes.  No visual disturbance.    ROS: Per HPI  Allergies  Allergen Reactions  . Carvedilol    Past Medical History:  Diagnosis Date  . Allergy   . Diabetes mellitus without complication ([place])   . GERD (gastroesophageal reflux disease)   . Hyperlipidemia   . Hypertension   . Sleep apnea   . Thyroid disease     Current Outpatient Medications:  .  atenolol (TENORMIN) 100 MG tablet, Take 1 tablet (100 mg total) by mouth daily., Disp: 90 tablet, Rfl: 3 .  atorvastatin (LIPITOR) 40 MG tablet, Take 1 tablet (40 mg total) by mouth daily., Disp: 90 tablet, Rfl: 3 .  Cholecalciferol 5000 units capsule, Take by mouth., Disp: , Rfl:  .  EUTHYROX 50 MCG tablet, Take 1 tablet by mouth once daily, Disp: 90 tablet, Rfl: 2 .  hydrochlorothiazide (HYDRODIURIL) 25 MG tablet, Take 1 tablet (25 mg total) by mouth daily., Disp: 90 tablet, Rfl: 3 .  losartan (COZAAR) 25 MG tablet, Take 1 tablet (25 mg total) by mouth daily., Disp: 90 tablet, Rfl: 3 .  metFORMIN (GLUCOPHAGE) 500 MG tablet, Take 2 tablets (1,000 mg total) by mouth 2 (two) times daily with a meal., Disp: 360 tablet, Rfl: 3 .  naproxen sodium (ALEVE) 220 MG tablet, Take 220 mg by mouth 2 (two) times daily  with a meal., Disp: , Rfl:  .  vitamin B-12 (CYANOCOBALAMIN) 1000 MCG tablet, Take 1,000 mcg by mouth daily., Disp: , Rfl:  .  vitamin C (ASCORBIC ACID) 500 MG tablet, Take 500 mg by mouth daily., Disp: , Rfl:  Social History   Socioeconomic History  . Marital status: Married    Spouse name: Not on file  . Number of children: Not on file  . Years of education: Not on file  . Highest education level: Not on file  Occupational History  . Not on file  Social Needs  . Financial resource strain: Not on file  . Food insecurity    Worry: Not on file    Inability: Not on file  . Transportation needs    Medical: Not on file    Non-medical: Not on file  Tobacco Use  . Smoking status: Never Smoker  . Smokeless tobacco: Never Used  Substance and Sexual Activity  . Alcohol use: Never    Frequency: Never  . Drug use: Never  . Sexual activity: Not on file  Lifestyle  . Physical activity    Days per week: Not on file    Minutes per session: Not on file  . Stress: Not on file  Relationships  . Social Herbalist on phone: Not on file    Gets together: Not on file    Attends religious service:  Not on file    Active member of club or organization: Not on file    Attends meetings of clubs or organizations: Not on file    Relationship status: Not on file  . Intimate partner violence    Fear of current or ex partner: Not on file    Emotionally abused: Not on file    Physically abused: Not on file    Forced sexual activity: Not on file  Other Topics Concern  . Not on file  Social History Narrative  . Not on file   Family History  Problem Relation Age of Onset  . Diabetes Mother   . Heart disease Mother   . COPD Father     Objective: Office vital signs reviewed. BP 116/70   Pulse 72   Temp 97.9 F (36.6 C) (Oral)   Ht 5\' 5"  (1.651 m)   Wt 261 lb (118.4 kg)   BMI 43.43 kg/m   Physical Examination:  General: Awake, alert, well nourished, No acute distress HEENT:  Normal, sclera white, MMM. No exophthalmos Cardio: regular rate and rhythm, S1S2 heard, no murmurs appreciated Pulm: clear to auscultation bilaterally, no wheezes, rhonchi or rales; normal work of breathing on room air Extremities: warm, well perfused, No edema, cyanosis or clubbing; +2 pulses bilaterally MSK: normal gait and station; small cyst palpable along lateral aspect of right achilles. Skin: dry; intact; no rashes or lesions Neuro: no tremor. See DM foot below Diabetic Foot Exam - Simple   Simple Foot Form Diabetic Foot exam was performed with the following findings: Yes 05/10/2019  3:33 PM  Visual Inspection See comments: Yes Sensation Testing Intact to touch and monofilament testing bilaterally: Yes Pulse Check Posterior Tibialis and Dorsalis pulse intact bilaterally: Yes Comments There is a small ganglion cyst noted on the right posterior aspect of the Achilles.  Additionally, she has varicosities noted along the medial aspects of bilateral feet      Assessment/ Plan: 63 y.o. female   1. Controlled type 2 diabetes mellitus without complication, without long-term current use of insulin (HCC) Under excellent control.  A1c 6.8 today.  Metformin refilled.  Get DM eye exam.  Foot exam done today. - Bayer DCA Hb A1c Waived  2. Hypertension associated with diabetes (HCC) Stable.  Continue dual regimen. Refills sent  3. Hyperlipidemia associated with type 2 diabetes mellitus (HCC) Stable.  Continue statin  4. Acquired hypothyroidism Defer TSH check until next visit. Asymptomatic. No refill needed  5. Ganglion cyst of right foot Appears to be a benign ganglion cyst.  Handout provided. Will refer if desires removal/ drainage  6. Morbid obesity with BMI of 40.0-44.9, adult (HCC) Working on lifestyle modification.    Orders Placed This Encounter  Procedures  . Bayer DCA Hb A1c Waived   Meds ordered this encounter  Medications  . atenolol (TENORMIN) 100 MG tablet     Sig: Take 1 tablet (100 mg total) by mouth daily.    Dispense:  90 tablet    Refill:  3  . atorvastatin (LIPITOR) 40 MG tablet    Sig: Take 1 tablet (40 mg total) by mouth daily.    Dispense:  90 tablet    Refill:  3  . hydrochlorothiazide (HYDRODIURIL) 25 MG tablet    Sig: Take 1 tablet (25 mg total) by mouth daily.    Dispense:  90 tablet    Refill:  3  . metFORMIN (GLUCOPHAGE) 500 MG tablet    Sig: Take 2 tablets (1,000  mg total) by mouth 2 (two) times daily with a meal.    Dispense:  360 tablet    Refill:  3     Joshu Furukawa M Elisabel Hanover, [facility]ulen [facility]ains [facility] [phone]

## 2019-05-10 NOTE — Patient Instructions (Addendum)
Please get your diabetic eye exam done and have them send me results. You are overdue for mammogram.  Please schedule this at your earliest convenience.  You had labs performed today.  You will be contacted with the results of the labs once they are available, usually in the next 3 business days for routine lab work.  If you have an active my chart account, they will be released to your MyChart.  If you prefer to have these labs released to you via telephone, please let us know.   Ganglion Cyst  A ganglion cyst is a non-cancerous, fluid-filled lump that occurs near a joint or tendon. The cyst grows out of a joint or the lining of a tendon. Ganglion cysts most often develop in the hand or wrist, but they can also develop in the shoulder, elbow, hip, knee, ankle, or foot. Ganglion cysts are ball-shaped or egg-shaped. Their size can range from the size of a pea to larger than a grape. Increased activity may cause the cyst to get bigger because more fluid starts to build up. What are the causes? The exact cause of this condition is not known, but it may be related to:  Inflammation or irritation around the joint.  An injury.  Repetitive movements or overuse.  Arthritis. What increases the risk? You are more likely to develop this condition if:  You are a woman.  You are 65-65 years old. What are the signs or symptoms? The main symptom of this condition is a lump. It most often appears on the hand or wrist. In many cases, there are no other symptoms, but a cyst can sometimes cause:  Tingling.  Pain.  Numbness.  Muscle weakness.  Weak grip.  Less range of motion in a joint. How is this diagnosed? Ganglion cysts are usually diagnosed based on a physical exam. Your health care provider will feel the lump and may shine a light next to it. If it is a ganglion cyst, the light will likely shine through it. Your health care provider may order an X-ray, ultrasound, or MRI to rule out  other conditions. How is this treated? Ganglion cysts often go away on their own without treatment. If you have pain or other symptoms, treatment may be needed. Treatment is also needed if the ganglion cyst limits your movement or if it gets infected. Treatment may include:  Wearing a brace or splint on your wrist or finger.  Taking anti-inflammatory medicine.  Having fluid drained from the lump with a needle (aspiration).  Getting a steroid injected into the joint.  Having surgery to remove the ganglion cyst.  Placing a pad on your shoe or wearing shoes that will not rub against the cyst if it is on your foot. Follow these instructions at home:  Do not press on the ganglion cyst, poke it with a needle, or hit it.  Take over-the-counter and prescription medicines only as told by your health care provider.  If you have a brace or splint: ? Wear it as told by your health care provider. ? Remove it as told by your health care provider. Ask if you need to remove it when you take a shower or a bath.  Watch your ganglion cyst for any changes.  Keep all follow-up visits as told by your health care provider. This is important. Contact a health care provider if:  Your ganglion cyst becomes larger or more painful.  You have pus coming from the lump.  You have weakness  or numbness in the affected area.  You have a fever or chills. Get help right away if:  You have a fever and have any of these in the cyst area: ? Increased redness. ? Red streaks. ? Swelling. Summary  A ganglion cyst is a non-cancerous, fluid-filled lump that occurs near a joint or tendon.  Ganglion cysts most often develop in the hand or wrist, but they can also develop in the shoulder, elbow, hip, knee, ankle, or foot.  Ganglion cysts often go away on their own without treatment. This information is not intended to replace advice given to you by your health care provider. Make sure you discuss any questions  you have with your health care provider. Document Released: 09/20/2000 Document Revised: 09/05/2017 Document Reviewed: 05/23/2017 Elsevier Patient Education  2020 [employer].

## 2019-05-10 NOTE — Addendum Note (Signed)
Addended byCarrolyn Leigh on: 05/10/2019 04:32 PM   Modules accepted: Orders

## 2019-05-11 ENCOUNTER — Telehealth: Payer: Self-pay | Admitting: Family Medicine

## 2019-05-11 MED ORDER — ACCU-CHEK SOFTCLIX LANCETS MISC: 12 refills | Status: AC

## 2019-05-11 MED ORDER — GLUCOSE BLOOD VI STRP: ORAL_STRIP | 12 refills | Status: AC

## 2019-05-11 MED ORDER — ULTICARE ALCOHOL SWABS 70 % PADS: 1.0000 | MEDICATED_PAD | Freq: Two times a day (BID) | 12 refills | Status: AC

## 2019-05-11 NOTE — Telephone Encounter (Signed)
What is the name of the medication? accu check soft click lancets and accu check plus test strips and alcohol wipes   Have you contacted your pharmacy to request a refill? No. Pt has apt with MMM yesterday and forgot to mention to her.  Which pharmacy would you like this sent to? [facility] in [place]   Patient notified that their request is being sent to the clinical staff for review and that they should receive a call once it is complete. If they do not receive a call within 24 hours they can check with their pharmacy or our office.

## 2019-05-11 NOTE — Telephone Encounter (Signed)
Rx sent over to the pharmacy and pt aware.

## 2019-06-18 ENCOUNTER — Other Ambulatory Visit: Payer: Self-pay

## 2019-06-21 ENCOUNTER — Ambulatory Visit (INDEPENDENT_AMBULATORY_CARE_PROVIDER_SITE_OTHER): Payer: Medicare Other

## 2019-06-21 ENCOUNTER — Other Ambulatory Visit: Payer: Self-pay

## 2019-06-21 ENCOUNTER — Ambulatory Visit (INDEPENDENT_AMBULATORY_CARE_PROVIDER_SITE_OTHER): Payer: Medicare Other | Admitting: Family Medicine

## 2019-06-21 ENCOUNTER — Encounter: Payer: Self-pay | Admitting: Family Medicine

## 2019-06-21 VITALS — BP 154/79 | HR 67 | Temp 95.3°F | Resp 20 | Ht 65.0 in | Wt 262.6 lb

## 2019-06-21 DIAGNOSIS — M545 Low back pain: Secondary | ICD-10-CM | POA: Diagnosis not present

## 2019-06-21 DIAGNOSIS — W19XXXA Unspecified fall, initial encounter: Secondary | ICD-10-CM

## 2019-06-21 DIAGNOSIS — M25562 Pain in left knee: Secondary | ICD-10-CM | POA: Diagnosis not present

## 2019-06-21 DIAGNOSIS — M5440 Lumbago with sciatica, unspecified side: Secondary | ICD-10-CM | POA: Diagnosis not present

## 2019-06-21 DIAGNOSIS — S3992XA Unspecified injury of lower back, initial encounter: Secondary | ICD-10-CM | POA: Diagnosis not present

## 2019-06-21 DIAGNOSIS — S8992XA Unspecified injury of left lower leg, initial encounter: Secondary | ICD-10-CM | POA: Diagnosis not present

## 2019-06-21 MED ORDER — CYCLOBENZAPRINE HCL 10 MG PO TABS: 10.0000 mg | ORAL_TABLET | Freq: Three times a day (TID) | ORAL | 1 refills | Status: DC | PRN

## 2019-06-21 MED ORDER — PREDNISONE 10 MG PO TABS: ORAL_TABLET | ORAL | 0 refills | Status: DC

## 2019-06-21 NOTE — Progress Notes (Signed)
Subjective:  Patient ID: Heather Palmer, female    DOB: [birth year]  Age: 63 y.o. MRN: [id number]  CC: Back Pain (after a fall x 3 weeks ago. Patient states that she was getting better but her back started hurting worse a few days ago. )   HPI Heather Palmer presents for Fall to the left side 3 weeks ago. Stumbled on something on the ground. Since then has had left lower back pain radiating to the left buttock and posterolateral thigh to the knee. She lifted something a few days ago and the pain got worse. Now pain is moderate and interfering with ambulation.   Depression screen [facility] 2/9 06/21/2019 05/10/2019 11/30/2018  Decreased Interest 0 0 0  Down, Depressed, Hopeless 0 0 0  PHQ - 2 Score 0 0 0  Altered sleeping - 0 1  Tired, decreased energy - 0 1  Change in appetite - 0 0  Feeling bad or failure about yourself  - 0 0  Trouble concentrating - 0 0  Moving slowly or fidgety/restless - 0 0  Suicidal thoughts - 0 0  PHQ-9 Score - 0 2  Difficult doing work/chores - Not difficult at all Not difficult at all    History Heather Palmer has a past medical history of Allergy, Diabetes mellitus without complication ([place]), GERD (gastroesophageal reflux disease), Hyperlipidemia, Hypertension, Sleep apnea, and Thyroid disease.   She has a past surgical history that includes Cesarean section.   Her family history includes COPD in her father; Diabetes in her mother; Heart disease in her mother.She reports that she has never smoked. She has never used smokeless tobacco. She reports that she does not drink alcohol or use drugs.    ROS Review of Systems  Constitutional: Negative.   HENT: Negative.   Eyes: Negative for visual disturbance.  Respiratory: Negative for shortness of breath.   Cardiovascular: Negative for chest pain.  Gastrointestinal: Negative for abdominal pain.  Musculoskeletal: Negative for arthralgias.    Objective:  BP (!) 154/79   Pulse 67   Temp (!) 95.3 F (35.2 C) (Temporal)   Resp 20    Ht 5\' 5"  (1.651 m)   Wt 262 lb 9.6 oz (119.1 kg)   SpO2 98%   BMI 43.70 kg/m   BP Readings from Last 3 Encounters:  06/21/19 (!) 154/79  05/10/19 116/70  11/30/18 133/74    Wt Readings from Last 3 Encounters:  06/21/19 262 lb 9.6 oz (119.1 kg)  05/10/19 261 lb (118.4 kg)  11/30/18 258 lb (117 kg)     Physical Exam Constitutional:      General: She is not in acute distress.    Appearance: She is well-developed.  Cardiovascular:     Rate and Rhythm: Normal rate and regular rhythm.  Pulmonary:     Breath sounds: Normal breath sounds.  Musculoskeletal:        General: Tenderness (over L5-S1, at sciatic notch. Also tender at left knee posteriorly. ) and signs of injury (bruising at left knee laterally and at left ankle.) present. No deformity.  Skin:    General: Skin is warm and dry.  Neurological:     Mental Status: She is alert and oriented to person, place, and time.       Assessment & Plan:   Heather Palmer was seen today for back pain.  Diagnoses and all orders for this visit:  Fall, initial encounter -     DG Knee 1-2 Views Left; Future -     DG  Lumbar Spine 2-3 Views; Future  Back pain of lumbar region with sciatica -     Ambulatory referral to Physical Therapy  Other orders -     predniSONE (DELTASONE) 10 MG tablet; Take 5 daily for 2 days followed by 4,3,2 and 1 for 2 days each. -     cyclobenzaprine (FLEXERIL) 10 MG tablet; Take 1 tablet (10 mg total) by mouth 3 (three) times daily as needed for muscle spasms.       I am having Heather Palmer start on predniSONE and cyclobenzaprine. I am also having her maintain her Cholecalciferol, losartan, Euthyrox, vitamin B-12, naproxen sodium, vitamin C, atenolol, atorvastatin, hydrochlorothiazide, metFORMIN, Accu-Chek Softclix Lancets, glucose blood, and UltiCare Alcohol Swabs.  Allergies as of 06/21/2019      Reactions   Carvedilol       Medication List       Accurate as of June 21, 2019 11:59 PM. If you have  any questions, ask your nurse or doctor.        Accu-Chek Softclix Lancets lancets Use to check blood sugars twice daily Dx E11.9   atenolol 100 MG tablet Commonly known as: TENORMIN Take 1 tablet (100 mg total) by mouth daily.   atorvastatin 40 MG tablet Commonly known as: LIPITOR Take 1 tablet (40 mg total) by mouth daily.   Cholecalciferol 125 MCG (5000 UT) capsule Take by mouth.   cyclobenzaprine 10 MG tablet Commonly known as: FLEXERIL Take 1 tablet (10 mg total) by mouth 3 (three) times daily as needed for muscle spasms. Started by: Mechele ClaudeWarren Kenai Fluegel, MD   Euthyrox 50 MCG tablet Generic drug: levothyroxine Take 1 tablet by mouth once daily   glucose blood test strip Use to check blood sugars twice daily Dx E11.9   hydrochlorothiazide 25 MG tablet Commonly known as: HYDRODIURIL Take 1 tablet (25 mg total) by mouth daily.   losartan 25 MG tablet Commonly known as: COZAAR Take 1 tablet (25 mg total) by mouth daily.   metFORMIN 500 MG tablet Commonly known as: GLUCOPHAGE Take 2 tablets (1,000 mg total) by mouth 2 (two) times daily with a meal.   naproxen sodium 220 MG tablet Commonly known as: ALEVE Take 220 mg by mouth 2 (two) times daily with a meal.   predniSONE 10 MG tablet Commonly known as: DELTASONE Take 5 daily for 2 days followed by 4,3,2 and 1 for 2 days each. Started by: Mechele ClaudeWarren Pearce Littlefield, MD   UltiCare Alcohol Swabs 70 % Pads 1 each by Does not apply route 2 (two) times daily. Dx E11.9   vitamin B-12 1000 MCG tablet Commonly known as: CYANOCOBALAMIN Take 1,000 mcg by mouth daily.   vitamin C 500 MG tablet Commonly known as: ASCORBIC ACID Take 500 mg by mouth daily.        Follow-up: No follow-ups on file.  Mechele ClaudeWarren Kendryck Lacroix, M.D.

## 2019-06-28 ENCOUNTER — Encounter: Payer: Self-pay | Admitting: Family Medicine

## 2019-08-04 ENCOUNTER — Other Ambulatory Visit: Payer: Self-pay | Admitting: Family Medicine

## 2019-10-06 ENCOUNTER — Other Ambulatory Visit: Payer: Self-pay | Admitting: Family Medicine

## 2019-10-06 ENCOUNTER — Telehealth: Payer: Self-pay | Admitting: Family Medicine

## 2019-10-06 DIAGNOSIS — U071 COVID-19: Secondary | ICD-10-CM

## 2019-10-06 MED ORDER — AZITHROMYCIN 250 MG PO TABS: ORAL_TABLET | ORAL | 0 refills | Status: DC

## 2019-10-06 NOTE — Telephone Encounter (Signed)
Patient wanted to let you know that she has COVID but she is getting better- FYI

## 2019-10-06 NOTE — Telephone Encounter (Signed)
Called patient. See note

## 2019-10-06 NOTE — Telephone Encounter (Signed)
I do not think I am understanding the question.  She is on the mend or a medication called ammend?  In other words is she having any symptoms that need to be treated.  If not, push fluids and supportive care recommended

## 2019-10-06 NOTE — Progress Notes (Signed)
I spoke to Mrs. Ransier today.  She notes that she is quite worried that she has had ongoing symptoms lasting almost 3 weeks now with COVID-19 virus.  Some days she feels totally fine but other days she has exacerbation of symptoms which include cough, congestion, fatigue.  Does not report any hemoptysis, shortness of breath, wheezing or fever.  Her husband was treated with a Z-Pak, prednisone and Plaquenil and seemed to respond really well to that.  She does note he has underlying lung disease.  We discussed that her symptoms are quite typical COVID-19 virus and I believe that these are infected viral symptoms.  Unsure that a Z-Pak is going to be especially helpful given the lack of concerning symptoms for bacterial infection.  I think that this was an appropriate treatment of her husband who does have underlying lung disease.  As was the prednisone.  I have not found good data for use of Plaquenil and in fact the use of this in hospitalized patients recently released by the [facility] of health noted that this was not in fact a good medicine to use and did not show any improvement in outcomes.  I think the risk of that medicine certainly outweighs the benefit.  We discussed that I would be willing to send in empiric Z-Pak and we discussed the possible complications of use of this medication including arrhythmia.  She understands reasons for emergent evaluation emergency department and will follow-up as needed.  Continue supportive care with vitamin C, zinc, rest and fluids.  Ashly M. Lajuana Ripple, [facility]

## 2019-10-06 NOTE — Telephone Encounter (Signed)
Patient called back and wanted to know if Dr.G would send her in a abx due to her positive COVID test.  States she has been having fatigue, runny nose, cough and headache.  No other symptoms.  Advised patient to treat the symptoms but she wants to know if Dr. Darnell Level will send her in a abc to [place] in [place]? Please advise

## 2019-11-10 ENCOUNTER — Ambulatory Visit: Payer: Medicare Other | Admitting: Family Medicine

## 2019-11-15 ENCOUNTER — Telehealth: Payer: Self-pay | Admitting: Family Medicine

## 2019-11-15 ENCOUNTER — Other Ambulatory Visit: Payer: Self-pay | Admitting: Family Medicine

## 2019-11-15 MED ORDER — LEVOTHYROXINE SODIUM 50 MCG PO TABS: 50.0000 ug | ORAL_TABLET | Freq: Every day | ORAL | 2 refills | Status: DC

## 2019-11-15 NOTE — Telephone Encounter (Signed)
Med sent in.

## 2019-11-15 NOTE — Telephone Encounter (Signed)
What is the name of the medication? EUTHYROX 50 MCG tablet   Have you contacted your pharmacy to request a refill? YES  Which pharmacy would you like this sent to? [facility], was previously using [facility] please change in her chart as well    Patient notified that their request is being sent to the clinical staff for review and that they should receive a call once it is complete. If they do not receive a call within 24 hours they can check with their pharmacy or our office.

## 2019-12-07 ENCOUNTER — Other Ambulatory Visit: Payer: Self-pay

## 2019-12-08 ENCOUNTER — Ambulatory Visit (INDEPENDENT_AMBULATORY_CARE_PROVIDER_SITE_OTHER): Payer: Medicare Other | Admitting: Family Medicine

## 2019-12-08 ENCOUNTER — Encounter: Payer: Self-pay | Admitting: Family Medicine

## 2019-12-08 VITALS — BP 134/80 | HR 66 | Temp 97.5°F | Ht 65.0 in | Wt 264.0 lb

## 2019-12-08 DIAGNOSIS — I10 Essential (primary) hypertension: Secondary | ICD-10-CM | POA: Diagnosis not present

## 2019-12-08 DIAGNOSIS — E785 Hyperlipidemia, unspecified: Secondary | ICD-10-CM | POA: Diagnosis not present

## 2019-12-08 DIAGNOSIS — E039 Hypothyroidism, unspecified: Secondary | ICD-10-CM | POA: Diagnosis not present

## 2019-12-08 DIAGNOSIS — E1165 Type 2 diabetes mellitus with hyperglycemia: Secondary | ICD-10-CM | POA: Diagnosis not present

## 2019-12-08 DIAGNOSIS — E119 Type 2 diabetes mellitus without complications: Secondary | ICD-10-CM | POA: Diagnosis not present

## 2019-12-08 DIAGNOSIS — J302 Other seasonal allergic rhinitis: Secondary | ICD-10-CM | POA: Diagnosis not present

## 2019-12-08 DIAGNOSIS — E1159 Type 2 diabetes mellitus with other circulatory complications: Secondary | ICD-10-CM

## 2019-12-08 DIAGNOSIS — E1169 Type 2 diabetes mellitus with other specified complication: Secondary | ICD-10-CM

## 2019-12-08 DIAGNOSIS — I152 Hypertension secondary to endocrine disorders: Secondary | ICD-10-CM

## 2019-12-08 LAB — BAYER DCA HB A1C WAIVED: HB A1C (BAYER DCA - WAIVED): 7.2 % — ABNORMAL HIGH (ref <7.0)

## 2019-12-08 MED ORDER — AZELASTINE HCL 0.1 % NA SOLN: 1.0000 | Freq: Two times a day (BID) | NASAL | 12 refills | Status: DC

## 2019-12-08 NOTE — Patient Instructions (Addendum)
You had labs performed today.  You will be contacted with the results of the labs once they are available, usually in the next 3 business days for routine lab work.  If you have an active my chart account, they will be released to your MyChart.  If you prefer to have these labs released to you via telephone, please let us know.  If you had a pap smear or biopsy performed, expect to be contacted in about 7-10 days.   I have sent in astelin nasal spray for you drainage.  You may consider adding something like Claritin or Allegra (these are non drowsy options).  Sugar is slightly elevated. A1c 7.2.  I am not changing medications today because of your recent illness.  We will recheck in 3 months.  If not back below 7, we can talk about adding Januvia to your metformin.

## 2019-12-08 NOTE — Progress Notes (Signed)
Subjective: CC: DM f/u; thyroid PCP: Janora Norlander, DO MGQ:QPYP Oyster is a 64 y.o. female presenting to clinic today for:  1. Type 2 Diabetes w/ HTN and HLD:  Patient reports that she feels good. Taking medication(s): Metformin 1035m BID, Losartan/ HCTZ, Lipitor.  She does not routinely monitor BGs unless she feels poorly.  No change in diet but was sick with COVID19 earlier this year.  Last eye exam: needs; she is scheduled Last foot exam: UTD Last A1c:  Lab Results  Component Value Date   HGBA1C 6.6 05/10/2019   Nephropathy screen indicated?: on ARB Last flu, zoster and/or pneumovax:  Immunization History  Administered Date(s) Administered  . Influenza,inj,Quad PF,6+ Mos 09/10/2018  . Pneumococcal Conjugate-13 05/10/2019  . Pneumococcal Polysaccharide-23 12/18/2016  . Tdap 12/18/2016    ROS: No chest pain, shortness of breath, lower extremity edema.  No sensation changes.  No visual disturbance.  2. Hypothyroidism She is noticing hair thinning.  Fatigue has improved.  Reports compliance with Synthroid.  3.  Rhinorrhea Patient reports allergic rhinitis.  She describes some rhinorrhea, nasal itching, eye watering and ear irritation.  Not currently using any oral antihistamines or nasal sprays but is interested in some.   ROS: Per HPI  Allergies  Allergen Reactions  . Carvedilol    Past Medical History:  Diagnosis Date  . Allergy   . Diabetes mellitus without complication ([place]   . GERD (gastroesophageal reflux disease)   . Hyperlipidemia   . Hypertension   . Sleep apnea   . Thyroid disease     Current Outpatient Medications:  .  Accu-Chek Softclix Lancets lancets, Use to check blood sugars twice daily Dx E11.9, Disp: 100 each, Rfl: 12 .  atenolol (TENORMIN) 100 MG tablet, Take 1 tablet (100 mg total) by mouth daily., Disp: 90 tablet, Rfl: 3 .  atorvastatin (LIPITOR) 40 MG tablet, Take 1 tablet (40 mg total) by mouth daily., Disp: 90 tablet, Rfl: 3 .   azithromycin (ZITHROMAX) 250 MG tablet, Take 2 tablets today, then take 1 tablet daily until gone., Disp: 6 tablet, Rfl: 0 .  Cholecalciferol 5000 units capsule, Take by mouth., Disp: , Rfl:  .  cyclobenzaprine (FLEXERIL) 10 MG tablet, Take 1 tablet (10 mg total) by mouth 3 (three) times daily as needed for muscle spasms., Disp: 90 tablet, Rfl: 1 .  glucose blood test strip, Use to check blood sugars twice daily Dx E11.9, Disp: 100 each, Rfl: 12 .  hydrochlorothiazide (HYDRODIURIL) 25 MG tablet, Take 1 tablet (25 mg total) by mouth daily., Disp: 90 tablet, Rfl: 3 .  levothyroxine (EUTHYROX) 50 MCG tablet, Take 1 tablet (50 mcg total) by mouth daily., Disp: 90 tablet, Rfl: 2 .  losartan (COZAAR) 25 MG tablet, Take 1 tablet by mouth once daily, Disp: 90 tablet, Rfl: 1 .  metFORMIN (GLUCOPHAGE) 500 MG tablet, Take 2 tablets (1,000 mg total) by mouth 2 (two) times daily with a meal., Disp: 360 tablet, Rfl: 3 .  naproxen sodium (ALEVE) 220 MG tablet, Take 220 mg by mouth 2 (two) times daily with a meal., Disp: , Rfl:  .  predniSONE (DELTASONE) 10 MG tablet, Take 5 daily for 2 days followed by 4,3,2 and 1 for 2 days each., Disp: 30 tablet, Rfl: 0 .  UltiCare Alcohol Swabs 70 % PADS, 1 each by Does not apply route 2 (two) times daily. Dx E11.9, Disp: 100 each, Rfl: 12 .  vitamin B-12 (CYANOCOBALAMIN) 1000 MCG tablet, Take 1,000 mcg by  mouth daily., Disp: , Rfl:  .  vitamin C (ASCORBIC ACID) 500 MG tablet, Take 500 mg by mouth daily., Disp: , Rfl:  Social History   Socioeconomic History  . Marital status: Married    Spouse name: Not on file  . Number of children: Not on file  . Years of education: Not on file  . Highest education level: Not on file  Occupational History  . Not on file  Tobacco Use  . Smoking status: Never Smoker  . Smokeless tobacco: Never Used  Substance and Sexual Activity  . Alcohol use: Never  . Drug use: Never  . Sexual activity: Not on file  Other Topics Concern  . Not  on file  Social History Narrative  . Not on file   Social Determinants of Health   Financial Resource Strain:   . Difficulty of Paying Living Expenses: Not on file  Food Insecurity:   . Worried About Charity fundraiser in the Last Year: Not on file  . Ran Out of Food in the Last Year: Not on file  Transportation Needs:   . Lack of Transportation (Medical): Not on file  . Lack of Transportation (Non-Medical): Not on file  Physical Activity:   . Days of Exercise per Week: Not on file  . Minutes of Exercise per Session: Not on file  Stress:   . Feeling of Stress : Not on file  Social Connections:   . Frequency of Communication with Friends and Family: Not on file  . Frequency of Social Gatherings with Friends and Family: Not on file  . Attends Religious Services: Not on file  . Active Member of Clubs or Organizations: Not on file  . Attends Archivist Meetings: Not on file  . Marital Status: Not on file  Intimate Partner Violence:   . Fear of Current or Ex-Partner: Not on file  . Emotionally Abused: Not on file  . Physically Abused: Not on file  . Sexually Abused: Not on file   Family History  Problem Relation Age of Onset  . Diabetes Mother   . Heart disease Mother   . COPD Father     Objective: Office vital signs reviewed. BP 134/80   Pulse 66   Temp (!) 97.5 F (36.4 C) (Temporal)   Ht _0  (1.651 m)   Wt 264 lb (119.7 kg)   SpO2 100%   BMI 43.93 kg/m   Physical Examination:  General: Awake, alert, well nourished, No acute distress HEENT: Normal, sclera white, MMM. No exophthalmos; TMs intact bilaterally.  Nasal turbinates slightly erythematous but not edematous.  Oropharynx with some cobblestone appearance. Cardio: regular rate and rhythm, S1S2 heard, no murmurs appreciated Pulm: clear to auscultation bilaterally, no wheezes, rhonchi or rales; normal work of breathing on room air Extremities: warm, well perfused, No edema, cyanosis or clubbing; +2  pulses bilaterally Skin: dry; intact; no rashes or lesions  Assessment/ Plan: 64 y.o. female   1. Uncontrolled type 2 diabetes mellitus with hyperglycemia (HCC) A1c up slightly to 7.2.  I suspect that her recent illness has something to do with this.  I am not going to make any medication changes today.  I have asked that she try and keep a little bit closer eye on her blood sugars.  We will plan to reconvene in 3 months and if persistently above goal, plan to add Januvia. - CMP14+EGFR - [facility]ayer DCA Hb A1c Waived  2. Hyperlipidemia associated with type 2 diabetes mellitus (  [place]) Check nonfasting lipid panel - Lipid Panel - CMP14+EGFR  3. Hypertension associated with diabetes ([place]) Controlled.  Continue current regimen - CMP14+EGFR  4. Acquired hypothyroidism Some hair thinning.  Check thyroid panel - Thyroid Panel With TSH  5. Seasonal allergic rhinitis, unspecified trigger Discussed options of Claritin versus Allegra versus Astelin.  Wishes to proceed with Astelin. - azelastine (ASTELIN) 0.1 % nasal spray; Place 1 spray into both nostrils 2 (two) times daily.  Dispense: 30 mL; Refill: 12  No orders of the defined types were placed in this encounter.  No orders of the defined types were placed in this encounter.    Janora Norlander, DO [place] [phone]

## 2019-12-09 LAB — CMP14+EGFR
ALT: 17 IU/L (ref 0–32)
AST: 18 IU/L (ref 0–40)
Albumin/Globulin Ratio: 1.3 (ref 1.2–2.2)
Albumin: 4.2 g/dL (ref 3.8–4.8)
Alkaline Phosphatase: 172 IU/L — ABNORMAL HIGH (ref 39–117)
BUN/Creatinine Ratio: 15 (ref 12–28)
BUN: 18 mg/dL (ref 8–27)
Bilirubin Total: 0.2 mg/dL (ref 0.0–1.2)
CO2: 25 mmol/L (ref 20–29)
Calcium: 10.1 mg/dL (ref 8.7–10.3)
Chloride: 99 mmol/L (ref 96–106)
Creatinine, Ser: 1.17 mg/dL — ABNORMAL HIGH (ref 0.57–1.00)
GFR calc Af Amer: 56 mL/min/{1.73_m2} — ABNORMAL LOW (ref >59)
GFR calc non Af Amer: 49 mL/min/{1.73_m2} — ABNORMAL LOW (ref >59)
Globulin, Total: 3.2 g/dL (ref 1.5–4.5)
Glucose: 111 mg/dL — ABNORMAL HIGH (ref 65–99)
Potassium: 4.3 mmol/L (ref 3.5–5.2)
Sodium: 140 mmol/L (ref 134–144)
Total Protein: 7.4 g/dL (ref 6.0–8.5)

## 2019-12-09 LAB — THYROID PANEL WITH TSH
Free Thyroxine Index: 2.1 (ref 1.2–4.9)
T3 Uptake Ratio: 28 % (ref 24–39)
T4, Total: 7.6 ug/dL (ref 4.5–12.0)
TSH: 2.45 u[IU]/mL (ref 0.450–4.500)

## 2019-12-09 LAB — LIPID PANEL
Chol/HDL Ratio: 2.9 ratio (ref 0.0–4.4)
Cholesterol, Total: 137 mg/dL (ref 100–199)
HDL: 47 mg/dL (ref >39)
LDL Chol Calc (NIH): 54 mg/dL (ref 0–99)
Triglycerides: 222 mg/dL — ABNORMAL HIGH (ref 0–149)
VLDL Cholesterol Cal: 36 mg/dL (ref 5–40)

## 2019-12-16 ENCOUNTER — Telehealth: Payer: Self-pay | Admitting: Family Medicine

## 2019-12-16 NOTE — Telephone Encounter (Signed)
[facility] called wanting to know if a different nasal spray could be sent over to them for patient. Says the Astelin is too expensive for the pt.

## 2019-12-17 ENCOUNTER — Other Ambulatory Visit: Payer: Self-pay | Admitting: *Deleted

## 2019-12-17 MED ORDER — FLUTICASONE PROPIONATE 50 MCG/ACT NA SUSP: 2.0000 | Freq: Every day | NASAL | 12 refills | Status: AC

## 2019-12-17 NOTE — Telephone Encounter (Signed)
Ok to substitute Flonase 2 sprays each nostril daily #1 w 12 rf

## 2020-01-25 ENCOUNTER — Telehealth: Payer: Self-pay | Admitting: Family Medicine

## 2020-02-08 ENCOUNTER — Telehealth: Payer: Self-pay | Admitting: Family Medicine

## 2020-02-08 MED ORDER — LOSARTAN POTASSIUM 25 MG PO TABS: 25.0000 mg | ORAL_TABLET | Freq: Every day | ORAL | 1 refills | Status: DC

## 2020-02-08 NOTE — Telephone Encounter (Signed)
Rx sent- left patient detailed message  

## 2020-02-08 NOTE — Telephone Encounter (Signed)
  Prescription Request  02/08/2020  What is the name of the medication or equipment? Losartan  Have you contacted your pharmacy to request a refill? (if applicable) Yes  Which pharmacy would you like this sent to? [facility]  Stew called from [facility] stating that patient switched pharmacies and is requesting refills on her Rx to be sent to [facility].  Patient notified that their request is being sent to the clinical staff for review and that they should receive a response within 2 business days.

## 2020-03-09 ENCOUNTER — Telehealth: Payer: Self-pay | Admitting: Family Medicine

## 2020-03-14 ENCOUNTER — Ambulatory Visit: Payer: Medicare Other | Admitting: Family Medicine

## 2020-03-30 IMAGING — DX DG KNEE 1-2V*L*
2 series · 2 of 2 positions shown · non-contrast
Comparison: None.

CLINICAL DATA: Fall, pain

EXAM:
LEFT KNEE - 1-2 VIEW

[knee lat]
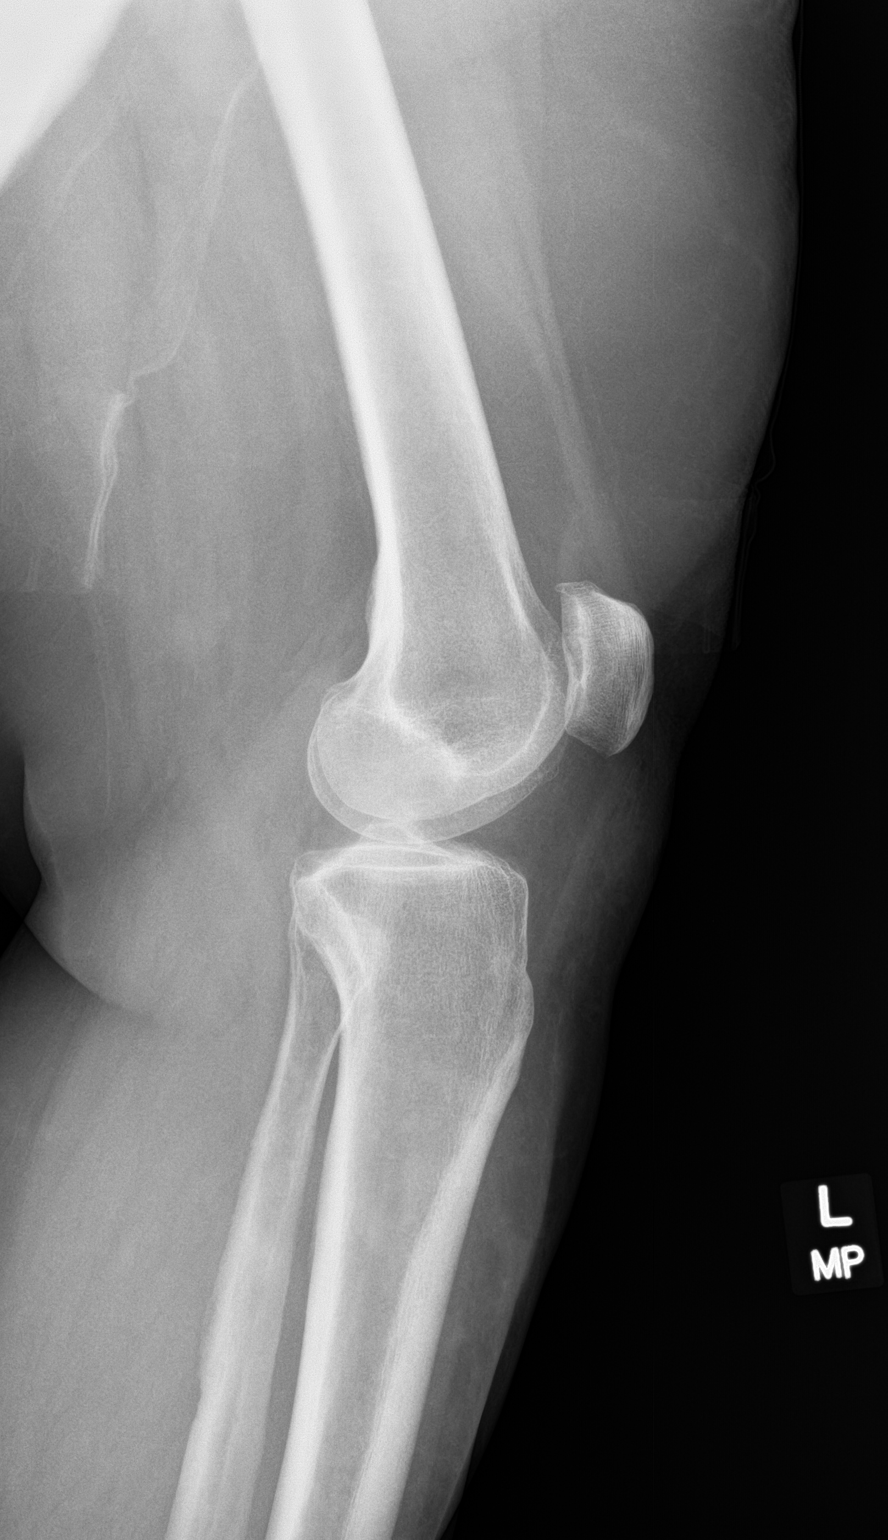

[knee ap]
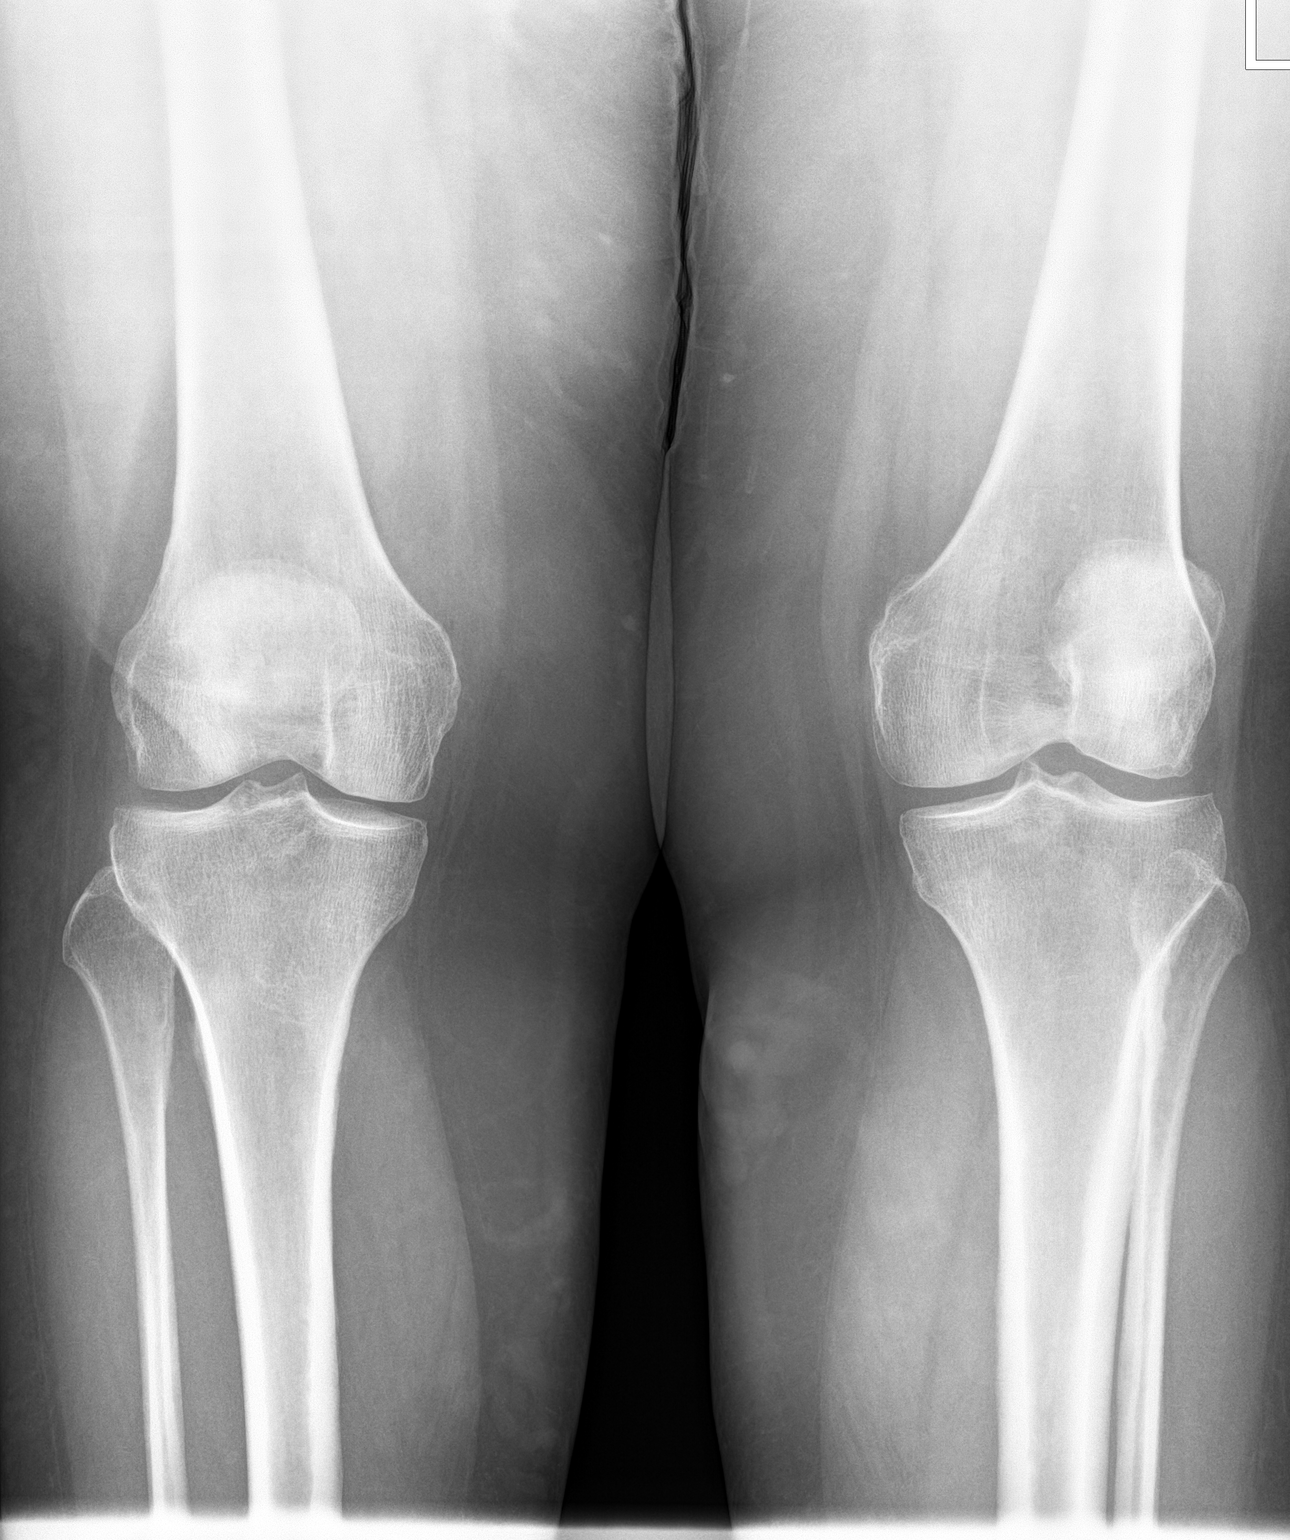

[2 of 2 positions shown; findings below may reference images not displayed]

FINDINGS: No fracture or dislocation of the left knee. Mild patellofemoral
joint space narrowing and osteophytosis. No knee joint effusion.
Soft tissues are unremarkable. Right knee is unremarkable in frontal
view only.
IMPRESSION: No fracture or dislocation of the left knee. Mild patellofemoral
joint space narrowing and osteophytosis. No knee joint effusion.

## 2020-04-12 ENCOUNTER — Encounter: Payer: Self-pay | Admitting: Family Medicine

## 2020-04-12 ENCOUNTER — Ambulatory Visit (INDEPENDENT_AMBULATORY_CARE_PROVIDER_SITE_OTHER): Payer: Medicare Other | Admitting: Family Medicine

## 2020-04-12 ENCOUNTER — Other Ambulatory Visit: Payer: Self-pay

## 2020-04-12 VITALS — BP 150/69 | HR 62 | Temp 97.0°F | Ht 65.0 in | Wt 266.2 lb

## 2020-04-12 DIAGNOSIS — I10 Essential (primary) hypertension: Secondary | ICD-10-CM | POA: Diagnosis not present

## 2020-04-12 DIAGNOSIS — E785 Hyperlipidemia, unspecified: Secondary | ICD-10-CM

## 2020-04-12 DIAGNOSIS — E1159 Type 2 diabetes mellitus with other circulatory complications: Secondary | ICD-10-CM

## 2020-04-12 DIAGNOSIS — M5442 Lumbago with sciatica, left side: Secondary | ICD-10-CM

## 2020-04-12 DIAGNOSIS — G8929 Other chronic pain: Secondary | ICD-10-CM

## 2020-04-12 DIAGNOSIS — E1169 Type 2 diabetes mellitus with other specified complication: Secondary | ICD-10-CM | POA: Diagnosis not present

## 2020-04-12 DIAGNOSIS — E1165 Type 2 diabetes mellitus with hyperglycemia: Secondary | ICD-10-CM | POA: Diagnosis not present

## 2020-04-12 LAB — BAYER DCA HB A1C WAIVED: HB A1C (BAYER DCA - WAIVED): 6.9 % (ref <7.0)

## 2020-04-12 MED ORDER — ATORVASTATIN CALCIUM 40 MG PO TABS: 40.0000 mg | ORAL_TABLET | Freq: Every day | ORAL | 3 refills | Status: DC

## 2020-04-12 MED ORDER — HYDROCHLOROTHIAZIDE 25 MG PO TABS: 25.0000 mg | ORAL_TABLET | Freq: Every day | ORAL | 3 refills | Status: DC

## 2020-04-12 MED ORDER — LOSARTAN POTASSIUM 25 MG PO TABS: 25.0000 mg | ORAL_TABLET | Freq: Every day | ORAL | 3 refills | Status: DC

## 2020-04-12 MED ORDER — METFORMIN HCL 500 MG PO TABS: 1000.0000 mg | ORAL_TABLET | Freq: Two times a day (BID) | ORAL | 3 refills | Status: DC

## 2020-04-12 MED ORDER — ATENOLOL 100 MG PO TABS: 100.0000 mg | ORAL_TABLET | Freq: Every day | ORAL | 3 refills | Status: DC

## 2020-04-12 NOTE — Progress Notes (Signed)
Subjective: CC: DM f/u; thyroid PCP: Janora Norlander, DO HLK:TGYB Arrighi is a 64 y.o. female presenting to clinic today for:  1. Type 2 Diabetes w/ HTN and HLD:  Patient reports that she feels good. Taking medication(s): Metformin 1023m BID, Losartan/ HCTZ, Lipitor.  She does not check BPs or BGs.  [place] son, who is expecting a child.  She has been having some issues with her current landlord.  Last eye exam: needs; she is scheduled Last foot exam: UTD Last A1c:  Lab Results  Component Value Date   HGBA1C 7.2 (H) 12/08/2019   Nephropathy screen indicated?: on ARB Last flu, zoster and/or pneumovax:  Immunization History  Administered Date(s) Administered  . Influenza,inj,Quad PF,6+ Mos 09/10/2018  . Pneumococcal Conjugate-13 05/10/2019  . Pneumococcal Polysaccharide-23 12/18/2016  . Tdap 12/18/2016    ROS: No chest pain, shortness of breath, lower extremity edema.  No sensation changes.  No visual disturbance.  2.  Leg pain She has been having ongoing left lower extremity pain.  This started after she sustained a fall last autumn.  Symptoms have pretty much remained the same and she has limping occasionally.  She is considering seeing a cRestaurant manager, fast food  However, if her insurance does not approve she would like to be [place] to an orthoped[place]  She will contact me if this is needed.   ROS: Per HPI  Allergies  Allergen Reactions  . Carvedilol    Past Medical History:  Diagnosis Date  . Allergy   . Diabetes mellitus without complication ([place]   . GERD (gastroesophageal reflux disease)   . Hyperlipidemia   . Hypertension   . Sleep apnea   . Thyroid disease     Current Outpatient Medications:  .  Accu-Chek Softclix Lancets lancets, Use to check blood sugars twice daily Dx E11.9, Disp: 100 each, Rfl: 12 .  atenolol (TENORMIN) 100 MG tablet, Take 1 tablet (100 mg total) by mouth daily., Disp: 90 tablet,  Rfl: 3 .  atorvastatin (LIPITOR) 40 MG tablet, Take 1 tablet (40 mg total) by mouth daily., Disp: 90 tablet, Rfl: 3 .  Cholecalciferol 5000 units capsule, Take by mouth., Disp: , Rfl:  .  fluticasone (FLONASE) 50 MCG/ACT nasal spray, Place 2 sprays into both nostrils daily., Disp: 16 g, Rfl: 12 .  glucose blood test strip, Use to check blood sugars twice daily Dx E11.9, Disp: 100 each, Rfl: 12 .  hydrochlorothiazide (HYDRODIURIL) 25 MG tablet, Take 1 tablet (25 mg total) by mouth daily., Disp: 90 tablet, Rfl: 3 .  levothyroxine (EUTHYROX) 50 MCG tablet, Take 1 tablet (50 mcg total) by mouth daily., Disp: 90 tablet, Rfl: 2 .  losartan (COZAAR) 25 MG tablet, Take 1 tablet (25 mg total) by mouth daily., Disp: 90 tablet, Rfl: 1 .  metFORMIN (GLUCOPHAGE) 500 MG tablet, Take 2 tablets (1,000 mg total) by mouth 2 (two) times daily with a meal., Disp: 360 tablet, Rfl: 3 .  naproxen sodium (ALEVE) 220 MG tablet, Take 220 mg by mouth 2 (two) times daily with a meal., Disp: , Rfl:  .  UltiCare Alcohol Swabs 70 % PADS, 1 each by Does not apply route 2 (two) times daily. Dx E11.9, Disp: 100 each, Rfl: 12 .  vitamin B-12 (CYANOCOBALAMIN) 1000 MCG tablet, Take 1,000 mcg by mouth daily., Disp: , Rfl:  .  vitamin C (ASCORBIC ACID) 500 MG tablet, Take 500 mg by mouth daily.,  Disp: , Rfl:  Social History   Socioeconomic History  . Marital status: Married    Spouse name: Not on file  . Number of children: Not on file  . Years of education: Not on file  . Highest education level: Not on file  Occupational History  . Not on file  Tobacco Use  . Smoking status: Never Smoker  . Smokeless tobacco: Never Used  Vaping Use  . Vaping Use: Never used  Substance and Sexual Activity  . Alcohol use: Never  . Drug use: Never  . Sexual activity: Not on file  Other Topics Concern  . Not on file  Social History Narrative  . Not on file   Social Determinants of Health   Financial Resource Strain:   . Difficulty of  Paying Living Expenses:   Food Insecurity:   . Worried About Charity fundraiser in the Last Year:   . Arboriculturist in the Last Year:   Transportation Needs:   . Film/video editor (Medical):   Marland Kitchen Lack of Transportation (Non-Medical):   Physical Activity:   . Days of Exercise per Week:   . Minutes of Exercise per Session:   Stress:   . Feeling of Stress :   Social Connections:   . Frequency of Communication with Friends and Family:   . Frequency of Social Gatherings with Friends and Family:   . Attends Religious Services:   . Active Member of Clubs or Organizations:   . Attends Archivist Meetings:   Marland Kitchen Marital Status:   Intimate Partner Violence:   . Fear of Current or Ex-Partner:   . Emotionally Abused:   Marland Kitchen Physically Abused:   . Sexually Abused:    Family History  Problem Relation Age of Onset  . Diabetes Mother   . Heart disease Mother   . COPD Father     Objective: Office vital signs reviewed. BP (!) 150/69   Pulse 62   Temp (!) 97 F (36.1 C)   Ht '5\' 5"'  (1.651 m)   Wt 266 lb 3.2 oz (120.7 kg)   SpO2 99%   BMI 44.30 kg/m   Physical Examination:  General: Awake, alert, well nourished, No acute distress HEENT: Normal, sclera white, MMM. No exophthalmos; no goiter Cardio: regular rate and rhythm, S1S2 heard, no murmurs appreciated Pulm: clear to auscultation bilaterally, no wheezes, rhonchi or rales; normal work of breathing on room air Extremities: warm, well perfused, No edema, cyanosis or clubbing; +2 pulses bilaterally MSK: Gait appears to be normal.  Assessment/ Plan: 64 y.o. female   1. Controlled type 2 diabetes mellitus with other specified complication, without long-term current use of insulin (HCC) A1c now below 7.  Continue current regimen.  I have renewed her medications.  She may follow-up in 6 months, sooner if needed.  Still needs diabetic eye exam but diabetic foot exam up-to-date - Bayer DCA Hb A1c Waived  2. Hyperlipidemia  associated with type 2 diabetes mellitus ([place]) Continue statin  3. Hypertension associated with diabetes ([place]) Continue atenolol, hydrochlorothiazide and losartan.  I have asked that she monitor blood pressures at home.  Goal less than 140/90.  If persistently elevated, may need to consider increasing losartan to 50 mg daily.  Patient aware of plan. - CMP14+EGFR  4. Chronic left-sided low back pain with left-sided sciatica Plan for referral to orthopedics in [place] if symptoms are not improving with chiropractic treatment    Orders Placed This Encounter  Procedures  .  Bayer DCA Hb A1c Waived  . CMP14+EGFR   No orders of the defined types were placed in this encounter.    Janora Norlander, DO [place] [phone]

## 2020-04-13 LAB — CMP14+EGFR
ALT: 16 IU/L (ref 0–32)
AST: 13 IU/L (ref 0–40)
Albumin/Globulin Ratio: 1.3 (ref 1.2–2.2)
Albumin: 4.1 g/dL (ref 3.8–4.8)
Alkaline Phosphatase: 149 IU/L — ABNORMAL HIGH (ref 48–121)
BUN/Creatinine Ratio: 25 (ref 12–28)
BUN: 21 mg/dL (ref 8–27)
Bilirubin Total: 0.2 mg/dL (ref 0.0–1.2)
CO2: 26 mmol/L (ref 20–29)
Calcium: 10.2 mg/dL (ref 8.7–10.3)
Chloride: 102 mmol/L (ref 96–106)
Creatinine, Ser: 0.85 mg/dL (ref 0.57–1.00)
GFR calc Af Amer: 83 mL/min/{1.73_m2} (ref >59)
GFR calc non Af Amer: 72 mL/min/{1.73_m2} (ref >59)
Globulin, Total: 3.1 g/dL (ref 1.5–4.5)
Glucose: 91 mg/dL (ref 65–99)
Potassium: 4.5 mmol/L (ref 3.5–5.2)
Sodium: 143 mmol/L (ref 134–144)
Total Protein: 7.2 g/dL (ref 6.0–8.5)

## 2020-04-18 LAB — THYROID PANEL WITH TSH
Free Thyroxine Index: 2.1 (ref 1.2–4.9)
T3 Uptake Ratio: 29 % (ref 24–39)
T4, Total: 7.4 ug/dL (ref 4.5–12.0)
TSH: 2.54 u[IU]/mL (ref 0.450–4.500)

## 2020-04-18 LAB — SPECIMEN STATUS REPORT

## 2020-04-19 LAB — HM DIABETES EYE EXAM

## 2020-06-07 ENCOUNTER — Other Ambulatory Visit: Payer: Self-pay | Admitting: Family Medicine

## 2020-10-16 ENCOUNTER — Ambulatory Visit: Payer: Medicare Other | Admitting: Family Medicine

## 2021-04-26 ENCOUNTER — Other Ambulatory Visit: Payer: Self-pay | Admitting: Family Medicine

## 2021-04-27 ENCOUNTER — Other Ambulatory Visit: Payer: Self-pay | Admitting: Family Medicine

## 2021-07-22 ENCOUNTER — Other Ambulatory Visit: Payer: Self-pay | Admitting: Family Medicine

## 2021-07-23 ENCOUNTER — Other Ambulatory Visit: Payer: Self-pay | Admitting: Family Medicine
